# Patient Record
Sex: Female | Born: 1955 | Race: White | Hispanic: No | Marital: Married | Smoking: Former smoker
Health system: Southern US, Community
[De-identification: ages and names within clinical notes are randomized; demographics above are authoritative.]

## PROBLEM LIST (undated history)

## (undated) DIAGNOSIS — J45909 Unspecified asthma, uncomplicated: Secondary | ICD-10-CM

## (undated) DIAGNOSIS — E119 Type 2 diabetes mellitus without complications: Secondary | ICD-10-CM

## (undated) HISTORY — DX: Unspecified asthma, uncomplicated: J45.909

## (undated) HISTORY — PX: BREAST REDUCTION SURGERY: SHX8

## (undated) HISTORY — DX: Type 2 diabetes mellitus without complications: E11.9

---

## 2013-12-31 ENCOUNTER — Encounter (INDEPENDENT_AMBULATORY_CARE_PROVIDER_SITE_OTHER): Payer: Managed Care, Other (non HMO) | Admitting: Ophthalmology

## 2013-12-31 DIAGNOSIS — H2513 Age-related nuclear cataract, bilateral: Secondary | ICD-10-CM | POA: Diagnosis not present

## 2013-12-31 DIAGNOSIS — H35371 Puckering of macula, right eye: Secondary | ICD-10-CM

## 2013-12-31 DIAGNOSIS — E11329 Type 2 diabetes mellitus with mild nonproliferative diabetic retinopathy without macular edema: Secondary | ICD-10-CM | POA: Diagnosis not present

## 2013-12-31 DIAGNOSIS — H43813 Vitreous degeneration, bilateral: Secondary | ICD-10-CM | POA: Diagnosis not present

## 2015-01-04 ENCOUNTER — Ambulatory Visit (INDEPENDENT_AMBULATORY_CARE_PROVIDER_SITE_OTHER): Payer: Managed Care, Other (non HMO) | Admitting: Ophthalmology

## 2015-01-04 DIAGNOSIS — H35371 Puckering of macula, right eye: Secondary | ICD-10-CM | POA: Diagnosis not present

## 2015-01-04 DIAGNOSIS — E113293 Type 2 diabetes mellitus with mild nonproliferative diabetic retinopathy without macular edema, bilateral: Secondary | ICD-10-CM | POA: Diagnosis not present

## 2015-01-04 DIAGNOSIS — E11319 Type 2 diabetes mellitus with unspecified diabetic retinopathy without macular edema: Secondary | ICD-10-CM | POA: Diagnosis not present

## 2015-01-04 DIAGNOSIS — H43813 Vitreous degeneration, bilateral: Secondary | ICD-10-CM | POA: Diagnosis not present

## 2016-01-04 ENCOUNTER — Ambulatory Visit (INDEPENDENT_AMBULATORY_CARE_PROVIDER_SITE_OTHER): Payer: Managed Care, Other (non HMO) | Admitting: Ophthalmology

## 2016-02-01 ENCOUNTER — Ambulatory Visit (INDEPENDENT_AMBULATORY_CARE_PROVIDER_SITE_OTHER): Payer: Managed Care, Other (non HMO) | Admitting: Ophthalmology

## 2016-02-04 DIAGNOSIS — Z23 Encounter for immunization: Secondary | ICD-10-CM | POA: Diagnosis not present

## 2016-03-20 DIAGNOSIS — J45901 Unspecified asthma with (acute) exacerbation: Secondary | ICD-10-CM | POA: Diagnosis not present

## 2016-03-20 DIAGNOSIS — J40 Bronchitis, not specified as acute or chronic: Secondary | ICD-10-CM | POA: Diagnosis not present

## 2017-02-27 DIAGNOSIS — J111 Influenza due to unidentified influenza virus with other respiratory manifestations: Secondary | ICD-10-CM | POA: Diagnosis not present

## 2017-02-27 DIAGNOSIS — H6122 Impacted cerumen, left ear: Secondary | ICD-10-CM | POA: Diagnosis not present

## 2017-07-09 ENCOUNTER — Encounter: Payer: Self-pay | Admitting: Sports Medicine

## 2017-07-09 ENCOUNTER — Encounter (INDEPENDENT_AMBULATORY_CARE_PROVIDER_SITE_OTHER): Payer: Self-pay

## 2017-07-09 ENCOUNTER — Ambulatory Visit (INDEPENDENT_AMBULATORY_CARE_PROVIDER_SITE_OTHER): Payer: BLUE CROSS/BLUE SHIELD

## 2017-07-09 ENCOUNTER — Ambulatory Visit (INDEPENDENT_AMBULATORY_CARE_PROVIDER_SITE_OTHER): Payer: BLUE CROSS/BLUE SHIELD | Admitting: Sports Medicine

## 2017-07-09 DIAGNOSIS — M545 Low back pain: Secondary | ICD-10-CM | POA: Diagnosis not present

## 2017-07-09 DIAGNOSIS — M5412 Radiculopathy, cervical region: Secondary | ICD-10-CM | POA: Diagnosis not present

## 2017-07-09 DIAGNOSIS — M47816 Spondylosis without myelopathy or radiculopathy, lumbar region: Secondary | ICD-10-CM

## 2017-07-09 DIAGNOSIS — M4316 Spondylolisthesis, lumbar region: Secondary | ICD-10-CM | POA: Diagnosis not present

## 2017-07-09 DIAGNOSIS — M542 Cervicalgia: Secondary | ICD-10-CM | POA: Diagnosis not present

## 2017-07-09 MED ORDER — GABAPENTIN 300 MG PO CAPS: ORAL_CAPSULE | ORAL | 3 refills | Status: AC

## 2017-07-09 MED ORDER — PREDNISONE 50 MG PO TABS: ORAL_TABLET | ORAL | 0 refills | Status: AC

## 2017-07-09 NOTE — Assessment & Plan Note (Signed)
Right C8 distribution, cervical epidurals worked well for years ago. Updated x-rays, MRI, and we will probably order the epidural at that time. Return to go over MRI results.

## 2017-07-09 NOTE — Progress Notes (Signed)
Subjective:    I'm seeing this patient as a consultation for:  Michele EchevariaBradley Laymon, PA-C  CC: Neck and back pain  HPI: Michele Murphy is a pleasant 62 year old female with a history of Klippel-Feil syndrome, she has cervical and lumbar spondylosis, she has been treated by an outside pain provider appropriately in the past.  She did have a cervical epidural 4 years ago that provided good relief of her axial neck pain with radiation down to the fourth and fifth fingers.  She is interested in doing this again.  No progressive weakness, no trauma, no constitutional symptoms.  In addition she has axial low back pain, occasional radiation down the right leg and S1 distribution.  In the past she has had an MRI that showed L3-S1 facet joint arthritis.  She did have medial branch blocks that provided good temporary relief but never proceeded with the radio frequency ablation.  No bowel or bladder dysfunction, saddle numbness, constitutional symptoms here.  She would like for me to take over her interventional pain management, she has taken gabapentin in the past and had good relief, and is not taking any narcotic controlled substances.  She would like a second opinion from neurosurgery as well although I did explain to her that there did not appear to be anything immediately surgical in her case.  I reviewed the past medical history, family history, social history, surgical history, and allergies today and no changes were needed.  Please see the problem list section below in epic for further details.  Past Medical History: Past Medical History:  Diagnosis Date  . Asthma   . Diabetes mellitus without complication [facility](HCC)    Past Surgical History: Past Surgical History:  Procedure Laterality Date  . BREAST REDUCTION SURGERY     Social History: Social History   Socioeconomic History  . Marital status: Married    Spouse name: Not on file  . Number of children: Not on file  . Years of education: Not on file  .  Highest education level: Not on file  Occupational History  . Not on file  Social Needs  . Financial resource strain: Not on file  . Food insecurity:    Worry: Not on file    Inability: Not on file  . Transportation needs:    Medical: Not on file    Non-medical: Not on file  Tobacco Use  . Smoking status: Former Games developermoker  . Smokeless tobacco: Never Used  Substance and Sexual Activity  . Alcohol use: Never    Frequency: Never  . Drug use: Never  . Sexual activity: Not on file  Lifestyle  . Physical activity:    Days per week: Not on file    Minutes per session: Not on file  . Stress: Not on file  Relationships  . Social connections:    Talks on phone: Not on file    Gets together: Not on file    Attends religious service: Not on file    Active member of club or organization: Not on file    Attends meetings of clubs or organizations: Not on file    Relationship status: Not on file  Other Topics Concern  . Not on file  Social History Narrative  . Not on file   Family History: Family History  Problem Relation Age of Onset  . Cancer Mother        ovarian   Allergies: Allergies  Allergen Reactions  . Sulfa Antibiotics Shortness Of Breath and Swelling  Medications: See med rec.  Review of Systems: No headache, visual changes, nausea, vomiting, diarrhea, constipation, dizziness, abdominal pain, skin rash, fevers, chills, night sweats, weight loss, swollen lymph nodes, body aches, joint swelling, muscle aches, chest pain, shortness of breath, mood changes, visual or auditory hallucinations.   Objective:   General: Well Developed, well nourished, and in no acute distress.  Neuro:  Extra-ocular muscles intact, able to move all 4 extremities, sensation grossly intact.  Deep tendon reflexes tested were normal. Psych: Alert and oriented, mood congruent with affect. ENT:  Ears and nose appear unremarkable.  Hearing grossly normal. Neck: Unremarkable overall appearance,  trachea midline.  No visible thyroid enlargement. Eyes: Conjunctivae and lids appear unremarkable.  Pupils equal and round. Skin: Warm and dry, no rashes noted.  Cardiovascular: Pulses palpable, no extremity edema. Neck: Negative spurling's Range of motion lacking, elevation of the left shoulder, all likely from KFS. Grip strength and sensation normal in bilateral hands Strength good C4 to T1 distribution No sensory change to C4 to T1 Reflexes normal Back Exam:  Inspection: Unremarkable  Motion: Flexion 45 deg, Extension 45 deg, Side Bending to 45 deg bilaterally,  Rotation to 45 deg bilaterally  SLR laying: Negative  XSLR laying: Negative  Palpable tenderness: None. FABER: negative. Sensory change: Gross sensation intact to all lumbar and sacral dermatomes.  Reflexes: 2+ at both patellar tendons, 2+ at achilles tendons, Babinski's downgoing.  Strength at foot  Plantar-flexion: 5/5 Dorsi-flexion: 5/5 Eversion: 5/5 Inversion: 5/5  Leg strength  Quad: 5/5 Hamstring: 5/5 Hip flexor: 5/5 Hip abductors: 5/5  Gait unremarkable.  Impression and Recommendations:   This case required medical decision making of moderate complexity.  Facet arthritis, degenerative, lumbar spine Patient was initially being managed by an outside pain provider, she did have L3-S1 facet joint medial branch blocks that provided good relief but never ended up having the radio frequency ablation. This was 4 to 5 years ago so we are going to get updated x-rays, MRI, and I would like her to see Dr. Benard Rink at [facility] imaging to restart the process of medial branch blocks and RFA. In the meantime gabapentin, prednisone. She would also like a second opinion from our neurosurgeon, I did advise her there was nothing surgical on her old imaging, but I think is appropriate to get a second opinion.  Radiculitis of right cervical region Right C8 distribution, cervical epidurals worked well for years ago. Updated x-rays,  MRI, and we will probably order the epidural at that time. Return to go over MRI results. ___________________________________________ Ihor Austin. Benjamin Stain, M.D., ABFM., CAQSM. Primary Care and Sports Medicine [facility]  Adjunct Instructor of Family Medicine  [employer]

## 2017-07-09 NOTE — Assessment & Plan Note (Signed)
Patient was initially being managed by an outside pain provider, she did have L3-S1 facet joint medial branch blocks that provided good relief but never ended up having the radio frequency ablation. This was 4 to 5 years ago so we are going to get updated x-rays, MRI, and I would like her to [facility] to restart the process of medial branch blocks and RFA. In the meantime gabapentin, prednisone. She would also like a second opinion from our neurosurgeon, I did advise her there was nothing surgical on her old imaging, but I think is appropriate to get a second opinion.

## 2017-07-22 ENCOUNTER — Ambulatory Visit (INDEPENDENT_AMBULATORY_CARE_PROVIDER_SITE_OTHER): Payer: BLUE CROSS/BLUE SHIELD

## 2017-07-22 DIAGNOSIS — M129 Arthropathy, unspecified: Secondary | ICD-10-CM

## 2017-07-22 DIAGNOSIS — Q761 Klippel-Feil syndrome: Secondary | ICD-10-CM

## 2017-07-22 DIAGNOSIS — M2548 Effusion, other site: Secondary | ICD-10-CM | POA: Diagnosis not present

## 2017-07-22 DIAGNOSIS — M5412 Radiculopathy, cervical region: Secondary | ICD-10-CM

## 2017-07-22 DIAGNOSIS — M545 Low back pain: Secondary | ICD-10-CM | POA: Diagnosis not present

## 2017-07-22 DIAGNOSIS — M47816 Spondylosis without myelopathy or radiculopathy, lumbar region: Secondary | ICD-10-CM

## 2017-07-22 DIAGNOSIS — M48061 Spinal stenosis, lumbar region without neurogenic claudication: Secondary | ICD-10-CM

## 2017-07-22 DIAGNOSIS — M4802 Spinal stenosis, cervical region: Secondary | ICD-10-CM | POA: Diagnosis not present

## 2017-07-22 DIAGNOSIS — M5011 Cervical disc disorder with radiculopathy,  high cervical region: Secondary | ICD-10-CM | POA: Diagnosis not present

## 2017-07-22 DIAGNOSIS — M542 Cervicalgia: Secondary | ICD-10-CM | POA: Diagnosis not present

## 2017-07-30 ENCOUNTER — Encounter: Payer: Self-pay | Admitting: Sports Medicine

## 2017-07-30 ENCOUNTER — Ambulatory Visit (INDEPENDENT_AMBULATORY_CARE_PROVIDER_SITE_OTHER): Payer: BLUE CROSS/BLUE SHIELD | Admitting: Sports Medicine

## 2017-07-30 DIAGNOSIS — M5412 Radiculopathy, cervical region: Secondary | ICD-10-CM | POA: Diagnosis not present

## 2017-07-30 DIAGNOSIS — M47816 Spondylosis without myelopathy or radiculopathy, lumbar region: Secondary | ICD-10-CM | POA: Diagnosis not present

## 2017-07-30 MED ORDER — DIAZEPAM 5 MG PO TABS: ORAL_TABLET | ORAL | 0 refills | Status: AC

## 2017-07-30 MED ORDER — DULOXETINE HCL 30 MG PO CPEP: 30.0000 mg | ORAL_CAPSULE | Freq: Every day | ORAL | 3 refills | Status: AC

## 2017-07-30 NOTE — Assessment & Plan Note (Signed)
MRI does confirm multilevel facet arthritis worst at L4-L5. In the past she did have medial branch blocks from L3-S1 bilaterally that provided good short-term relief but she never proceeded with RFA. We are starting the process again, ordering bilateral L3 S1 facet medial branch blocks and RFA if effective. She did try some CBD oil as well which she feels as though provided some good relief. Also adding duloxetine, the SNRI effect will improve her muscular skeletal pain as well as her depression, she can follow-up with her PCP for more of this..Michele Murphy

## 2017-07-30 NOTE — Progress Notes (Signed)
Subjective:    CC: Follow-up MRI  HPI: Cervical radiculopathy: Right-sided, C8 distribution, MRI done.  Agreeable to proceed with a cervical epidural.  Low back pain: Multilevel facet arthritis worst at L4-L5, historically she had a bilateral L3-S1 facet medial branch block with good temporary relief but never proceeded with radio frequency ablation, she is agreeable to do this now.  She also has some depressed mood without suicidal or homicidal ideation, understands that this may hijack our treatment of her neck and back pain.  Agreeable to try an SNRI.  I reviewed the past medical history, family history, social history, surgical history, and allergies today and no changes were needed.  Please see the problem list section below in epic for further details.  Past Medical History: Past Medical History:  Diagnosis Date  . Asthma   . Diabetes mellitus without complication [facility](HCC)    Past Surgical History: Past Surgical History:  Procedure Laterality Date  . BREAST REDUCTION SURGERY     Social History: Social History   Socioeconomic History  . Marital status: Married    Spouse name: Not on file  . Number of children: Not on file  . Years of education: Not on file  . Highest education level: Not on file  Occupational History  . Not on file  Social Needs  . Financial resource strain: Not on file  . Food insecurity:    Worry: Not on file    Inability: Not on file  . Transportation needs:    Medical: Not on file    Non-medical: Not on file  Tobacco Use  . Smoking status: Former Games developermoker  . Smokeless tobacco: Never Used  Substance and Sexual Activity  . Alcohol use: Never    Frequency: Never  . Drug use: Never  . Sexual activity: Not on file  Lifestyle  . Physical activity:    Days per week: Not on file    Minutes per session: Not on file  . Stress: Not on file  Relationships  . Social connections:    Talks on phone: Not on file    Gets together: Not on file   Attends religious service: Not on file    Active member of club or organization: Not on file    Attends meetings of clubs or organizations: Not on file    Relationship status: Not on file  Other Topics Concern  . Not on file  Social History Narrative  . Not on file   Family History: Family History  Problem Relation Age of Onset  . Cancer Mother        ovarian   Allergies: Allergies  Allergen Reactions  . Sulfa Antibiotics Shortness Of Breath and Swelling   Medications: See med rec.  Review of Systems: No fevers, chills, night sweats, weight loss, chest pain, or shortness of breath.   Objective:    General: Well Developed, well nourished, and in no acute distress.  Neuro: Alert and oriented x3, extra-ocular muscles intact, sensation grossly intact.  HEENT: Normocephalic, atraumatic, pupils equal round reactive to light, neck supple, no masses, no lymphadenopathy, thyroid nonpalpable.  Skin: Warm and dry, no rashes. Cardiac: Regular rate and rhythm, no murmurs rubs or gallops, no lower extremity edema.  Respiratory: Clear to auscultation bilaterally. Not using accessory muscles, speaking in full sentences.  Impression and Recommendations:    Facet arthritis, degenerative, lumbar spine MRI does confirm multilevel facet arthritis worst at L4-L5. In the past she did have medial branch blocks from L3-S1 bilaterally  that provided good short-term relief but she never proceeded with RFA. We are starting the process again, ordering bilateral L3 S1 facet medial branch blocks and RFA if effective. She did try some CBD oil as well which she feels as though provided some good relief. Also adding duloxetine, the SNRI effect will improve her muscular skeletal pain as well as her depression, she can follow-up with her PCP for more of this..  Radiculitis of right cervical region On MRI I am not seeing any overt neuroforaminal stenosis on the right C8 nerve root but her symptoms are so  classic we are still going to proceed with a right C7-T1 interlaminar epidural, return to see me 1 month after injection. Adding Valium for preprocedural anxiolysis.  I spent 25 minutes with this patient, greater than 50% was face-to-face time counseling regarding the above diagnoses ___________________________________________ Ihor Austin. Benjamin Stain, M.D., ABFM., CAQSM. Primary Care and Sports Medicine [facility]  Adjunct Instructor of Family Medicine  [employer]

## 2017-07-30 NOTE — Assessment & Plan Note (Signed)
On MRI I am not seeing any overt neuroforaminal stenosis on the right C8 nerve root but her symptoms are so classic we are still going to proceed with a right C7-T1 interlaminar epidural, return to see me 1 month after injection. Adding Valium for preprocedural anxiolysis.

## 2017-11-20 ENCOUNTER — Other Ambulatory Visit: Payer: Self-pay | Admitting: Sports Medicine

## 2017-11-20 DIAGNOSIS — M47816 Spondylosis without myelopathy or radiculopathy, lumbar region: Secondary | ICD-10-CM

## 2017-11-26 ENCOUNTER — Inpatient Hospital Stay: Admission: RE | Admit: 2017-11-26 | Payer: BLUE CROSS/BLUE SHIELD | Source: Ambulatory Visit

## 2018-06-16 DIAGNOSIS — Z87891 Personal history of nicotine dependence: Secondary | ICD-10-CM | POA: Diagnosis not present

## 2018-06-16 DIAGNOSIS — B029 Zoster without complications: Secondary | ICD-10-CM | POA: Diagnosis not present

## 2018-10-07 DIAGNOSIS — Z23 Encounter for immunization: Secondary | ICD-10-CM | POA: Diagnosis not present

## 2018-12-22 DIAGNOSIS — S99922A Unspecified injury of left foot, initial encounter: Secondary | ICD-10-CM | POA: Diagnosis not present

## 2018-12-22 DIAGNOSIS — M79672 Pain in left foot: Secondary | ICD-10-CM | POA: Diagnosis not present

## 2018-12-22 DIAGNOSIS — Z882 Allergy status to sulfonamides status: Secondary | ICD-10-CM | POA: Diagnosis not present

## 2018-12-22 DIAGNOSIS — S99912A Unspecified injury of left ankle, initial encounter: Secondary | ICD-10-CM | POA: Diagnosis not present

## 2019-01-08 IMAGING — DX DG CERVICAL SPINE COMPLETE 4+V
6 series · 6 of 6 positions shown · non-contrast
Comparison: None.

CLINICAL DATA: Right-sided neck pain.  Grabrovec.

EXAM:
CERVICAL SPINE - COMPLETE 4+ VIEW

[c-spine lat]
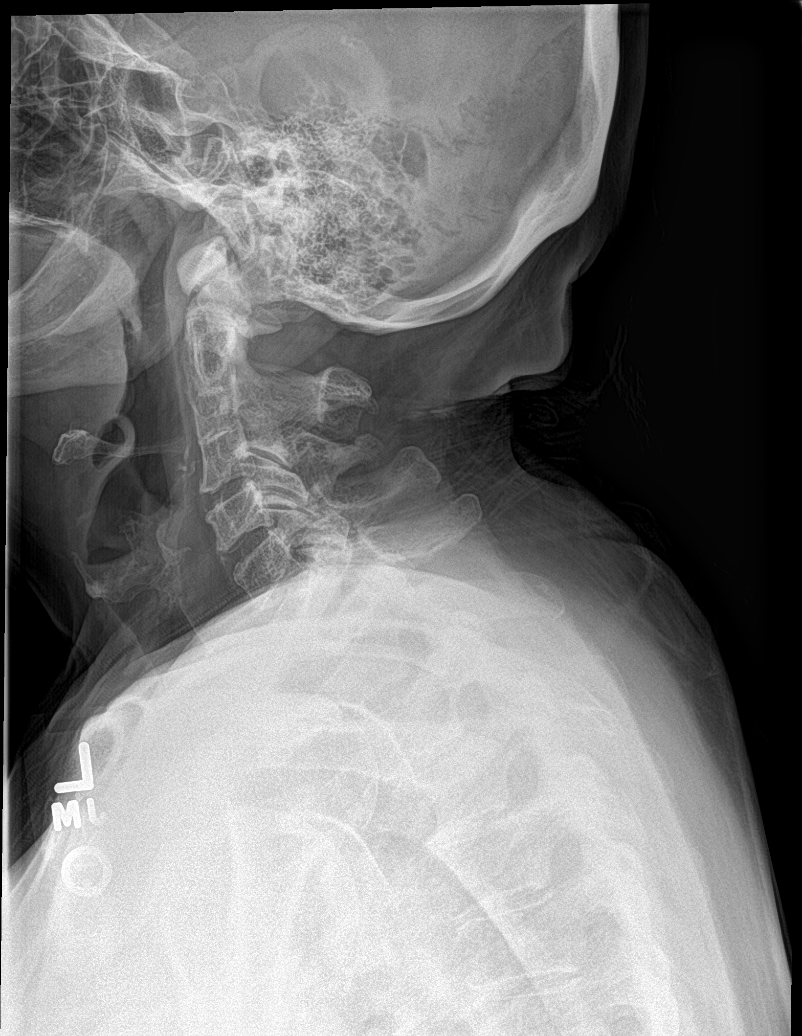

[c-spine obl (1 of 2)]
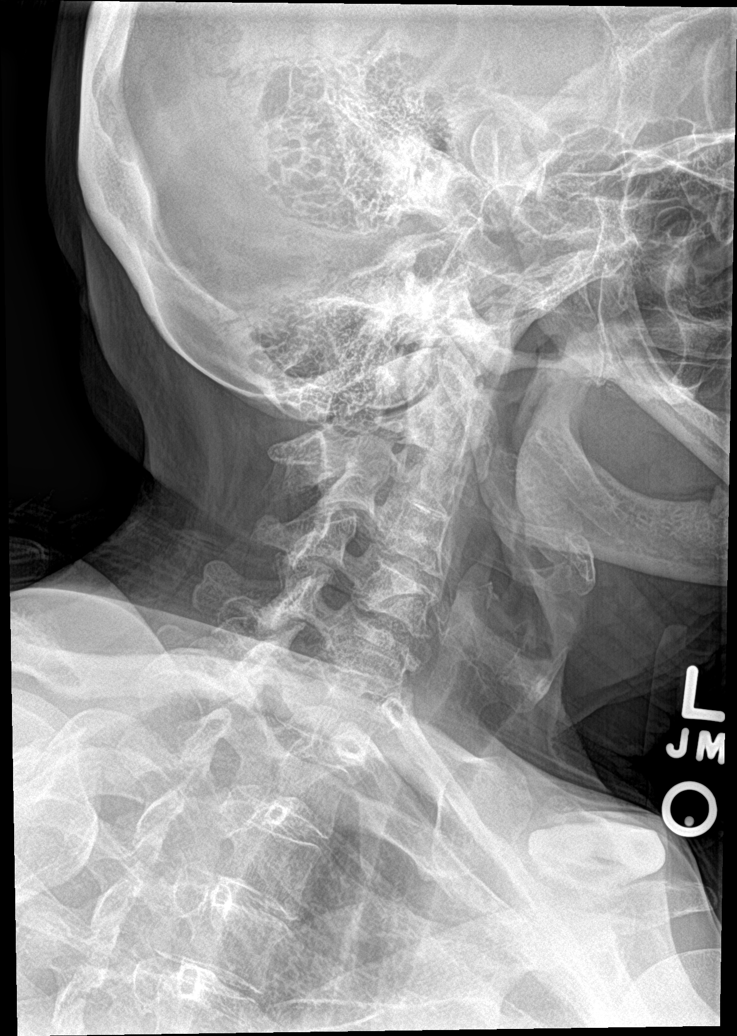

[c-spine obl (2 of 2)]
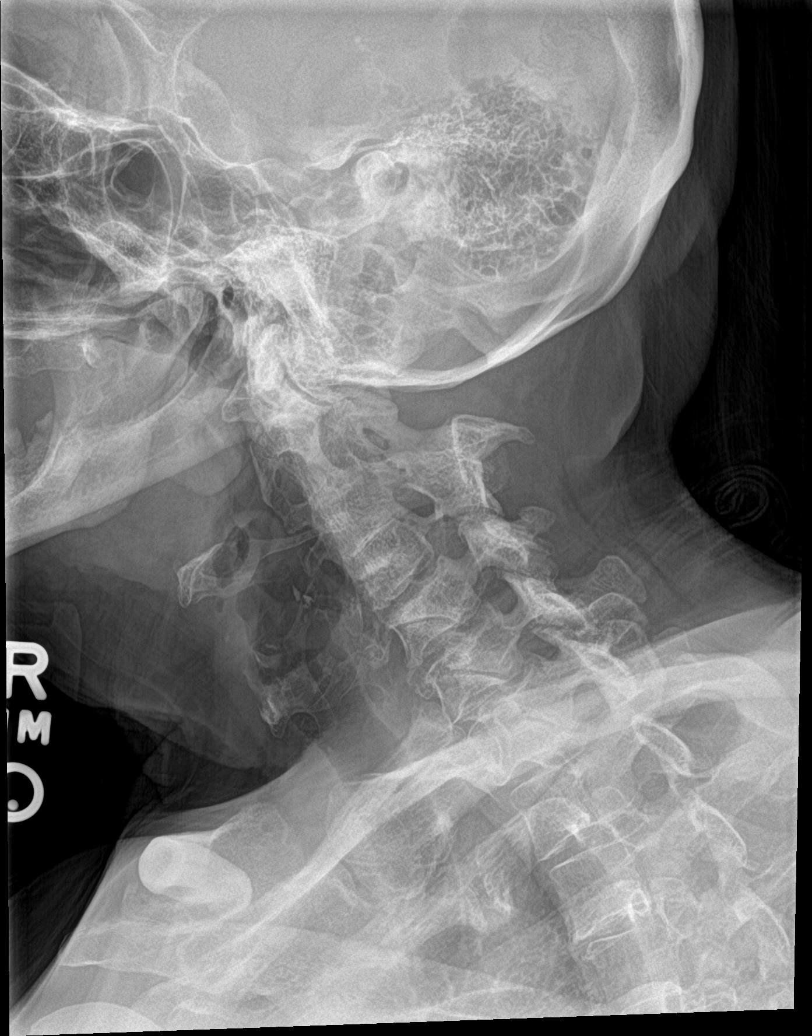

[c-spine ap]
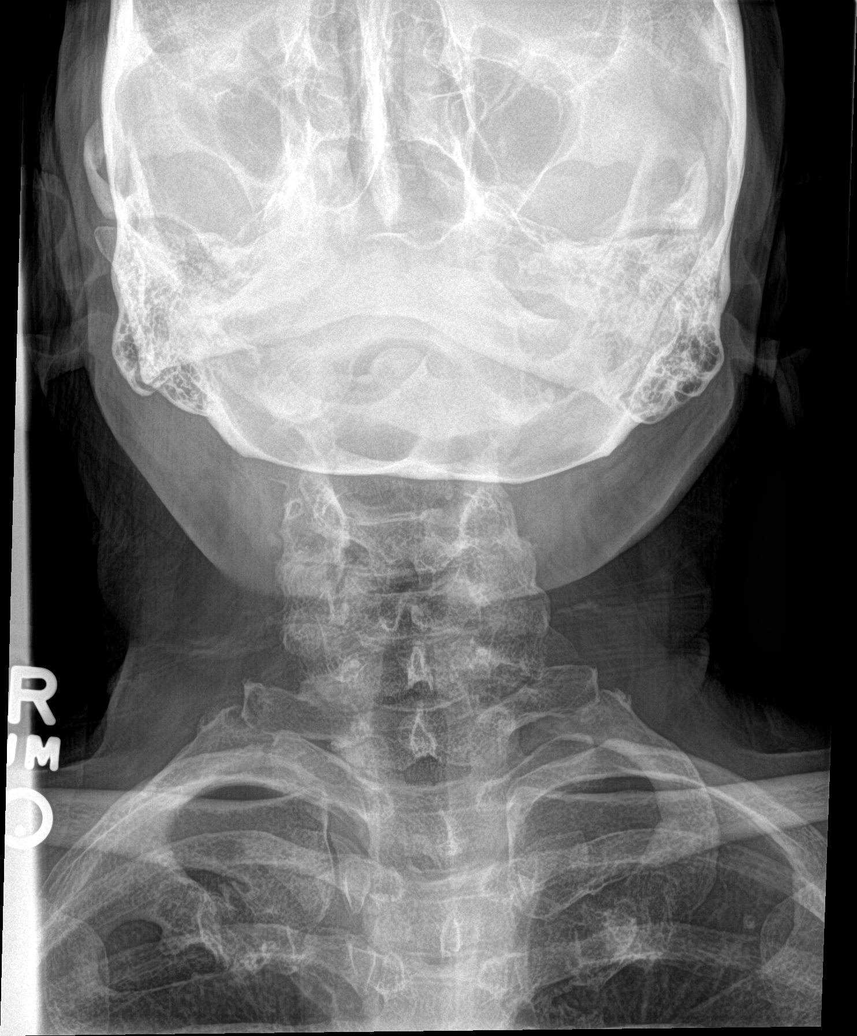

[c-spine open mouth]
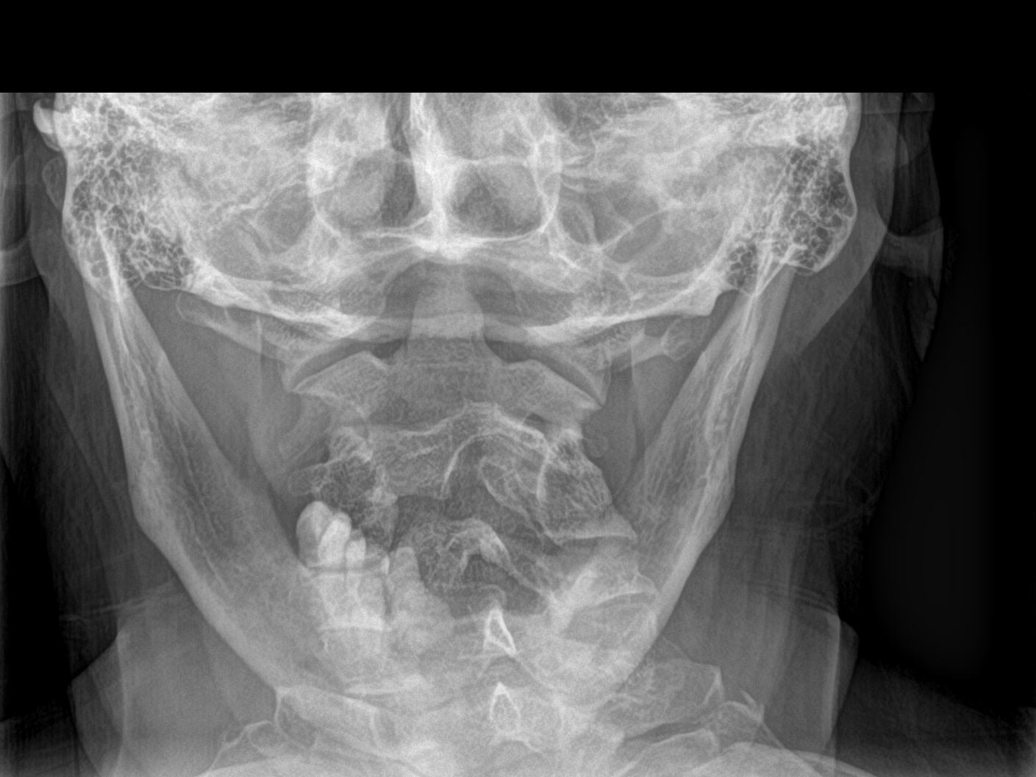

[c-spine swimmers]
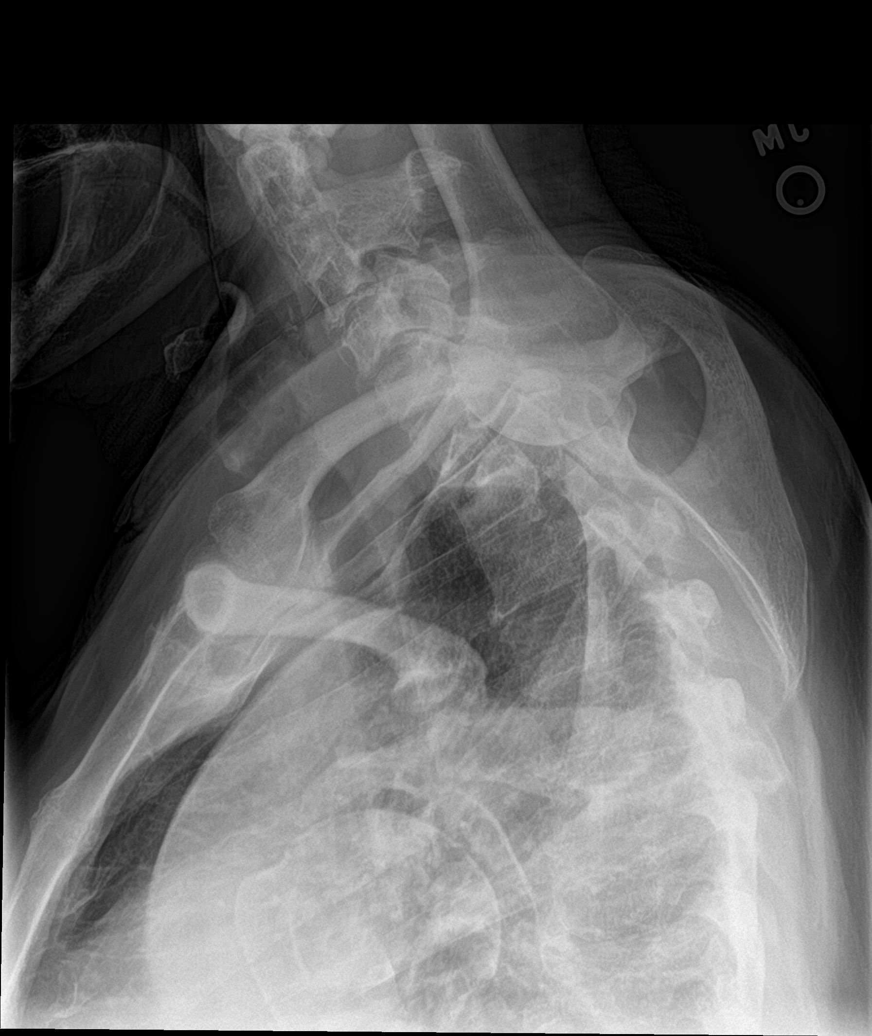

[6 of 6 positions shown; findings below may reference images not displayed]

FINDINGS: Incomplete segmentation affecting C2-3 and likely C3-4 (based on the
foramina seen on the oblique views). The posterior arch of C1 is
hypoplastic or assimilated into the occiput put, with hypertrophic
anterior arch. No detectable acute fracture. No evidence of bone
lesion or erosion. The open cervical discs are mildly narrowed. No
prevertebral thickening.
IMPRESSION: 1. C2-3 and C3-4 non segmentation.
2. Dysmorphic C1 ring with hypertrophic anterior arch. An os
odontoideum is not excluded, bony anatomy would be better assessed
by CT.
